# Patient Record
Sex: Male | Born: 1940 | Race: Black or African American | Hispanic: No | Marital: Married | Smoking: Current every day smoker
Health system: Southern US, Community
[De-identification: ages and names within clinical notes are randomized; demographics above are authoritative.]

## PROBLEM LIST (undated history)

## (undated) DIAGNOSIS — Z8719 Personal history of other diseases of the digestive system: Secondary | ICD-10-CM

## (undated) DIAGNOSIS — I499 Cardiac arrhythmia, unspecified: Secondary | ICD-10-CM

## (undated) DIAGNOSIS — Z86010 Personal history of colonic polyps: Secondary | ICD-10-CM

## (undated) DIAGNOSIS — M1990 Unspecified osteoarthritis, unspecified site: Secondary | ICD-10-CM

## (undated) DIAGNOSIS — I071 Rheumatic tricuspid insufficiency: Secondary | ICD-10-CM

## (undated) DIAGNOSIS — H269 Unspecified cataract: Secondary | ICD-10-CM

## (undated) DIAGNOSIS — K219 Gastro-esophageal reflux disease without esophagitis: Secondary | ICD-10-CM

## (undated) DIAGNOSIS — I493 Ventricular premature depolarization: Secondary | ICD-10-CM

## (undated) DIAGNOSIS — I10 Essential (primary) hypertension: Secondary | ICD-10-CM

## (undated) DIAGNOSIS — I209 Angina pectoris, unspecified: Secondary | ICD-10-CM

## (undated) DIAGNOSIS — G4730 Sleep apnea, unspecified: Secondary | ICD-10-CM

## (undated) DIAGNOSIS — C801 Malignant (primary) neoplasm, unspecified: Secondary | ICD-10-CM

## (undated) DIAGNOSIS — E119 Type 2 diabetes mellitus without complications: Secondary | ICD-10-CM

## (undated) DIAGNOSIS — Z860101 Personal history of adenomatous and serrated colon polyps: Secondary | ICD-10-CM

## (undated) HISTORY — PX: COLONOSCOPY: SHX174

## (undated) HISTORY — PX: EYE SURGERY: SHX253

---

## 2005-05-13 ENCOUNTER — Ambulatory Visit: Payer: Self-pay | Admitting: Internal Medicine

## 2005-07-27 ENCOUNTER — Ambulatory Visit: Payer: Self-pay | Admitting: Gastroenterology

## 2005-09-18 ENCOUNTER — Ambulatory Visit: Payer: Self-pay | Admitting: Gastroenterology

## 2006-03-25 ENCOUNTER — Emergency Department: Payer: Self-pay

## 2006-03-30 ENCOUNTER — Emergency Department: Payer: Self-pay | Admitting: Internal Medicine

## 2006-08-30 ENCOUNTER — Ambulatory Visit: Payer: Self-pay | Admitting: Gastroenterology

## 2006-09-20 ENCOUNTER — Ambulatory Visit: Payer: Self-pay | Admitting: Unknown Physician Specialty

## 2006-09-20 ENCOUNTER — Other Ambulatory Visit: Payer: Self-pay

## 2006-09-22 ENCOUNTER — Ambulatory Visit: Payer: Self-pay | Admitting: Unknown Physician Specialty

## 2006-09-26 ENCOUNTER — Ambulatory Visit: Payer: Self-pay | Admitting: Unknown Physician Specialty

## 2006-10-21 ENCOUNTER — Ambulatory Visit: Payer: Self-pay | Admitting: Unknown Physician Specialty

## 2007-07-30 ENCOUNTER — Other Ambulatory Visit: Payer: Self-pay

## 2007-07-30 ENCOUNTER — Emergency Department: Payer: Self-pay | Admitting: Emergency Medicine

## 2009-07-09 ENCOUNTER — Ambulatory Visit: Payer: Self-pay | Admitting: Oncology

## 2009-07-24 ENCOUNTER — Inpatient Hospital Stay: Payer: Self-pay | Admitting: Internal Medicine

## 2009-08-04 ENCOUNTER — Ambulatory Visit: Payer: Self-pay | Admitting: Surgery

## 2009-08-08 ENCOUNTER — Ambulatory Visit: Payer: Self-pay | Admitting: Oncology

## 2009-08-14 ENCOUNTER — Ambulatory Visit: Payer: Self-pay | Admitting: Oncology

## 2009-08-27 ENCOUNTER — Ambulatory Visit: Payer: Self-pay | Admitting: Surgery

## 2009-09-08 ENCOUNTER — Ambulatory Visit: Payer: Self-pay | Admitting: Oncology

## 2010-07-20 ENCOUNTER — Ambulatory Visit: Payer: Self-pay | Admitting: Gastroenterology

## 2010-07-23 LAB — PATHOLOGY REPORT

## 2011-09-19 ENCOUNTER — Emergency Department: Payer: Self-pay | Admitting: Emergency Medicine

## 2013-10-29 ENCOUNTER — Ambulatory Visit: Payer: Self-pay | Admitting: Gastroenterology

## 2013-10-30 LAB — PATHOLOGY REPORT

## 2013-12-27 ENCOUNTER — Ambulatory Visit: Payer: Self-pay | Admitting: Gastroenterology

## 2016-01-12 ENCOUNTER — Emergency Department
Admission: EM | Admit: 2016-01-12 | Discharge: 2016-01-12 | Disposition: A | Discharge status: Home / Self Care | Payer: Medicare Other | Attending: Emergency Medicine | Admitting: Emergency Medicine

## 2016-01-12 ENCOUNTER — Emergency Department: Payer: Medicare Other

## 2016-01-12 ENCOUNTER — Encounter: Payer: Self-pay | Admitting: Emergency Medicine

## 2016-01-12 DIAGNOSIS — M545 Low back pain, unspecified: Secondary | ICD-10-CM

## 2016-01-12 DIAGNOSIS — I10 Essential (primary) hypertension: Secondary | ICD-10-CM | POA: Insufficient documentation

## 2016-01-12 HISTORY — DX: Essential (primary) hypertension: I10

## 2016-01-12 MED ORDER — CYCLOBENZAPRINE HCL 5 MG PO TABS: 5.0000 mg | ORAL_TABLET | Freq: Three times a day (TID) | ORAL | 0 refills | Status: AC | PRN

## 2016-01-12 MED ORDER — NABUMETONE 750 MG PO TABS: 750.0000 mg | ORAL_TABLET | Freq: Two times a day (BID) | ORAL | 0 refills | Status: AC

## 2016-01-12 MED ORDER — KETOROLAC TROMETHAMINE 30 MG/ML IJ SOLN
30.0000 mg | Freq: Once | INTRAMUSCULAR | Status: AC
  Administered 2016-01-12: 30 mg via INTRAMUSCULAR
  Filled 2016-01-12: qty 1

## 2016-01-12 NOTE — ED Triage Notes (Signed)
Reports lower back pain x 2 weeks, radiates down right leg.  Seen at clinic 2 wks ago and given muscle relaxer's with no relief.  Skin w/d, MAE

## 2016-01-12 NOTE — Discharge Instructions (Signed)
Your exam and x-rays are essentially normal today. You may be experiencing back pain due to muscle strain or some mild facet arthritis. Take the prescription meds as directed. If symptoms persist, follow-up with your primary provider or Dr. Mack Guise.

## 2016-01-12 NOTE — ED Provider Notes (Signed)
[facility] Emergency Department Provider Note ____________________________________________  Time seen: 1512  I have reviewed the triage vital signs and the nursing notes.  HISTORY  Chief Complaint  Back Pain  HPI Ralph Williamson. is a 75 y.o. male presents to the ED for evaluation of continued low back pain for the last 2+ weeks. Patient describes onset of pain about 2-1/2 weeks ago. He was evaluated by [facility] at the time, and discharged with a prescription of muscle relaxant and pain medicine. He reports completing both medication courses but describes pain relief is only been limited. He describes the pain radiates from the lower back into both hips. He denies any distal paresthesias, foot drop, or incontinence. He denies any remote history of ongoing or chronic back pain. He also denies any recent injury, accident, trauma, or fall. He reports he has never had x-rays of his lower back in the past. He also gives a remote history of some elevated liver function and has concern that that is causing his symptoms at this time.  Past Medical History:  Diagnosis Date  . Hypertension     There are no active problems to display for this patient.   History reviewed. No pertinent surgical history.  Prior to Admission medications   Medication Sig Start Date End Date Taking? Authorizing Provider  cyclobenzaprine (FLEXERIL) 5 MG tablet Take 1 tablet (5 mg total) by mouth 3 (three) times daily as needed for muscle spasms. 01/12/16   Tranisha Tissue V Bacon Michaeline Eckersley, PA-C  nabumetone (RELAFEN) 750 MG tablet Take 1 tablet (750 mg total) by mouth 2 (two) times daily. 01/12/16   Dannielle Karvonen Simrin Vegh, PA-C    Allergies Patient has no known allergies.  No family history on file.  Social History Social History  Substance Use Topics  . Smoking status: Never Smoker  . Smokeless tobacco: Never Used  . Alcohol use Not on file    Review of Systems  Constitutional: Negative for  fever. Cardiovascular: Negative for chest pain. Respiratory: Negative for shortness of breath. Gastrointestinal: Negative for abdominal pain, vomiting and diarrhea. Genitourinary: Negative for dysuria. Musculoskeletal: Positive for back pain. Skin: Negative for rash. Neurological: Negative for headaches, focal weakness or numbness. ____________________________________________  PHYSICAL EXAM:  VITAL SIGNS: ED Triage Vitals  Enc Vitals Group     BP 01/12/16 1349 138/85     Pulse Rate 01/12/16 1349 70     Resp 01/12/16 1349 20     Temp 01/12/16 1349 98 F (36.7 C)     Temp Source 01/12/16 1349 Oral     SpO2 01/12/16 1349 99 %     Weight 01/12/16 1349 220 lb (99.8 kg)     Height 01/12/16 1349 5\' 11"  (1.803 m)     Head Circumference --      Peak Flow --      Pain Score 01/12/16 1350 10     Pain Loc --      Pain Edu? --      Excl. in [place]? --     Constitutional: Alert and oriented. Well appearing and in no distress. Head: Normocephalic and atraumatic. Cardiovascular: Normal rate, regular rhythm. Normal distal pulses. Respiratory: Normal respiratory effort. No wheezes/rales/rhonchi. Gastrointestinal: Soft and nontender. No distention. Musculoskeletal: Patient transitioned slowly from supine to sit. He seems to grimace with discomfort and notes spasm to the lower back with transition. Negative seated straight leg raise bilaterally. Nontender with normal range of motion in all extremities.  Neurologic: Cranial nerves II  through XII grossly intact. Normal LE DTRs bilaterally. Normal gait without ataxia. Normal speech and language. No gross focal neurologic deficits are appreciated. Skin:  Skin is warm, dry and intact. No rash noted. ____________________________________________   RADIOLOGY Lumbar Spine Complete  IMPRESSION: No acute or focal abnormality.  I, Jeremih Dearmas, Dannielle Karvonen, personally viewed and evaluated these images (plain radiographs) as part of my medical decision  making, as well as reviewing the written report by the radiologist. ____________________________________________  PROCEDURES  Toradol 30 mg IM ____________________________________________  INITIAL IMPRESSION / ASSESSMENT AND PLAN / ED COURSE  Patient with acute, intermittent tent low back pain without any focal abdominal skin x-ray. Patient may have back pain related to muscle spasm versus some mild facet arthropathy. He is discharged with prescriptions for Relafen and Flexeril. He will follow-up with his primary care provider or Dr. Mack Guise for ongoing symptom management.  Clinical Course    ____________________________________________  FINAL CLINICAL IMPRESSION(S) / ED DIAGNOSES  Final diagnoses:  Acute midline low back pain without sciatica      Melvenia Needles, PA-C 01/12/16 2356    Nance Pear, MD 01/13/16 0003

## 2017-02-08 DIAGNOSIS — H269 Unspecified cataract: Secondary | ICD-10-CM

## 2017-02-08 DIAGNOSIS — I499 Cardiac arrhythmia, unspecified: Secondary | ICD-10-CM

## 2017-02-08 HISTORY — DX: Cardiac arrhythmia, unspecified: I49.9

## 2017-02-08 HISTORY — DX: Unspecified cataract: H26.9

## 2017-02-15 ENCOUNTER — Encounter: Payer: Self-pay | Admitting: *Deleted

## 2017-02-15 NOTE — Pre-Procedure Instructions (Signed)
RECENT ANGINA. SEEN AND CLEARED BY DR PARASCHOS 02/14/17

## 2017-02-22 ENCOUNTER — Encounter
Admission: RE | Disposition: A | Discharge status: Home / Self Care | Payer: Self-pay | Source: Ambulatory Visit | Attending: Ophthalmology

## 2017-02-22 ENCOUNTER — Ambulatory Visit
Admission: RE | Admit: 2017-02-22 | Discharge: 2017-02-22 | Disposition: A | Discharge status: Home / Self Care | Payer: Medicare Other | Source: Ambulatory Visit | Attending: Ophthalmology | Admitting: Ophthalmology

## 2017-02-22 ENCOUNTER — Other Ambulatory Visit: Payer: Self-pay

## 2017-02-22 ENCOUNTER — Ambulatory Visit: Payer: Medicare Other | Admitting: Certified Registered Nurse Anesthetist

## 2017-02-22 ENCOUNTER — Encounter: Payer: Self-pay | Admitting: *Deleted

## 2017-02-22 DIAGNOSIS — I10 Essential (primary) hypertension: Secondary | ICD-10-CM | POA: Diagnosis not present

## 2017-02-22 DIAGNOSIS — F17200 Nicotine dependence, unspecified, uncomplicated: Secondary | ICD-10-CM | POA: Insufficient documentation

## 2017-02-22 DIAGNOSIS — H2511 Age-related nuclear cataract, right eye: Secondary | ICD-10-CM | POA: Diagnosis not present

## 2017-02-22 DIAGNOSIS — E119 Type 2 diabetes mellitus without complications: Secondary | ICD-10-CM | POA: Diagnosis not present

## 2017-02-22 DIAGNOSIS — K219 Gastro-esophageal reflux disease without esophagitis: Secondary | ICD-10-CM | POA: Diagnosis not present

## 2017-02-22 DIAGNOSIS — Z79899 Other long term (current) drug therapy: Secondary | ICD-10-CM | POA: Insufficient documentation

## 2017-02-22 DIAGNOSIS — G4730 Sleep apnea, unspecified: Secondary | ICD-10-CM | POA: Insufficient documentation

## 2017-02-22 HISTORY — DX: Angina pectoris, unspecified: I20.9

## 2017-02-22 HISTORY — DX: Sleep apnea, unspecified: G47.30

## 2017-02-22 HISTORY — DX: Cardiac arrhythmia, unspecified: I49.9

## 2017-02-22 HISTORY — PX: CATARACT EXTRACTION W/PHACO: SHX586

## 2017-02-22 HISTORY — DX: Unspecified cataract: H26.9

## 2017-02-22 HISTORY — DX: Gastro-esophageal reflux disease without esophagitis: K21.9

## 2017-02-22 SURGERY — PHACOEMULSIFICATION, CATARACT, WITH IOL INSERTION
Anesthesia: Monitor Anesthesia Care | Site: Eye | Laterality: Right | Wound class: Clean

## 2017-02-22 MED ORDER — SODIUM CHLORIDE 0.9 % IV SOLN
INTRAVENOUS | Status: DC
  Administered 2017-02-22: 50 mL/h via INTRAVENOUS

## 2017-02-22 MED ORDER — MOXIFLOXACIN HCL 0.5 % OP SOLN: 1.0000 [drp] | OPHTHALMIC | Status: DC | PRN

## 2017-02-22 MED ORDER — ARMC OPHTHALMIC DILATING DROPS
1.0000 "application " | OPHTHALMIC | Status: AC
  Administered 2017-02-22 (×3): 1 via OPHTHALMIC

## 2017-02-22 MED ORDER — NA CHONDROIT SULF-NA HYALURON 40-17 MG/ML IO SOLN
INTRAOCULAR | Status: AC
  Filled 2017-02-22: qty 1

## 2017-02-22 MED ORDER — MOXIFLOXACIN HCL 0.5 % OP SOLN
OPHTHALMIC | Status: AC
  Filled 2017-02-22: qty 3

## 2017-02-22 MED ORDER — ONDANSETRON HCL 4 MG/2ML IJ SOLN: 4.0000 mg | Freq: Once | INTRAMUSCULAR | Status: DC | PRN

## 2017-02-22 MED ORDER — BISOPROLOL FUMARATE 5 MG PO TABS
5.0000 mg | ORAL_TABLET | Freq: Once | ORAL | Status: AC
  Administered 2017-02-22: 5 mg via ORAL
  Filled 2017-02-22: qty 1

## 2017-02-22 MED ORDER — EPINEPHRINE PF 1 MG/ML IJ SOLN
INTRAOCULAR | Status: DC | PRN
  Administered 2017-02-22: 1 mL via OPHTHALMIC

## 2017-02-22 MED ORDER — EPINEPHRINE PF 1 MG/ML IJ SOLN
INTRAMUSCULAR | Status: AC
  Filled 2017-02-22: qty 1

## 2017-02-22 MED ORDER — ARMC OPHTHALMIC DILATING DROPS
OPHTHALMIC | Status: AC
  Administered 2017-02-22: 1 via OPHTHALMIC
  Filled 2017-02-22: qty 0.4

## 2017-02-22 MED ORDER — POVIDONE-IODINE 5 % OP SOLN
OPHTHALMIC | Status: DC | PRN
  Administered 2017-02-22: 1 via OPHTHALMIC

## 2017-02-22 MED ORDER — LIDOCAINE HCL (PF) 4 % IJ SOLN
INTRAMUSCULAR | Status: AC
  Filled 2017-02-22: qty 5

## 2017-02-22 MED ORDER — MOXIFLOXACIN HCL 0.5 % OP SOLN
OPHTHALMIC | Status: DC | PRN
  Administered 2017-02-22: .2 mL via OPHTHALMIC

## 2017-02-22 MED ORDER — FENTANYL CITRATE (PF) 100 MCG/2ML IJ SOLN
INTRAMUSCULAR | Status: AC
  Filled 2017-02-22: qty 2

## 2017-02-22 MED ORDER — NA CHONDROIT SULF-NA HYALURON 40-17 MG/ML IO SOLN
INTRAOCULAR | Status: DC | PRN
  Administered 2017-02-22: 1 mL via INTRAOCULAR

## 2017-02-22 MED ORDER — CARBACHOL 0.01 % IO SOLN
INTRAOCULAR | Status: DC | PRN
  Administered 2017-02-22: .5 mL via INTRAOCULAR

## 2017-02-22 MED ORDER — FENTANYL CITRATE (PF) 100 MCG/2ML IJ SOLN
INTRAMUSCULAR | Status: DC | PRN
  Administered 2017-02-22: 50 ug via INTRAVENOUS

## 2017-02-22 MED ORDER — POVIDONE-IODINE 5 % OP SOLN
OPHTHALMIC | Status: AC
  Filled 2017-02-22: qty 30

## 2017-02-22 MED ORDER — LIDOCAINE HCL (PF) 4 % IJ SOLN
INTRAOCULAR | Status: DC | PRN
  Administered 2017-02-22: 2 mL via OPHTHALMIC

## 2017-02-22 MED ORDER — FENTANYL CITRATE (PF) 100 MCG/2ML IJ SOLN
25.0000 ug | INTRAMUSCULAR | Status: DC | PRN
Start: 2017-02-22 — End: 2017-02-22

## 2017-02-22 SURGICAL SUPPLY — 16 items
GLOVE BIO SURGEON STRL SZ8 (GLOVE) ×3 IMPLANT
GLOVE BIOGEL M 6.5 STRL (GLOVE) ×3 IMPLANT
GLOVE SURG LX 8.0 MICRO (GLOVE) ×2
GLOVE SURG LX STRL 8.0 MICRO (GLOVE) ×1 IMPLANT
GOWN STRL REUS W/ TWL LRG LVL3 (GOWN DISPOSABLE) ×2 IMPLANT
GOWN STRL REUS W/TWL LRG LVL3 (GOWN DISPOSABLE) ×6
LABEL CATARACT MEDS ST (LABEL) ×3 IMPLANT
LENS IOL TECNIS ITEC 13.5 (Intraocular Lens) ×2 IMPLANT
PACK CATARACT (MISCELLANEOUS) ×3 IMPLANT
PACK CATARACT BRASINGTON LX (MISCELLANEOUS) ×3 IMPLANT
PACK EYE AFTER SURG (MISCELLANEOUS) ×3 IMPLANT
SOL BSS BAG (MISCELLANEOUS) ×3
SOLUTION BSS BAG (MISCELLANEOUS) ×1 IMPLANT
SYR 5ML LL (SYRINGE) ×3 IMPLANT
WATER STERILE IRR 250ML POUR (IV SOLUTION) ×3 IMPLANT
WIPE NON LINTING 3.25X3.25 (MISCELLANEOUS) ×3 IMPLANT

## 2017-02-22 NOTE — H&P (Signed)
All labs reviewed. Abnormal studies sent to patients PCP when indicated.  Previous H&P reviewed, patient examined, there are NO CHANGES.  Ralph Commons Porfilio1/15/20197:17 AM

## 2017-02-22 NOTE — Discharge Instructions (Signed)
Eye Surgery Discharge Instructions  Expect mild scratchy sensation or mild soreness. DO NOT RUB YOUR EYE!  The day of surgery:  Minimal physical activity, but bed rest is not required  No reading, computer work, or close hand work  No bending, lifting, or straining.  May watch TV  For 24 hours:  No driving, legal decisions, or alcoholic beverages  Safety precautions  Eat anything you prefer: It is better to start with liquids, then soup then solid foods.  _____ Eye patch should be worn until postoperative exam tomorrow.  ____ Solar shield eyeglasses should be worn for comfort in the sunlight/patch while sleeping  Resume all regular medications including aspirin or Coumadin if these were discontinued prior to surgery. You may shower, bathe, shave, or wash your hair. Tylenol may be taken for mild discomfort.  Call your doctor if you experience significant pain, nausea, or vomiting, fever > 101 or other signs of infection. [phone] or [phone] Specific instructions:  Follow-up Information    Birder Robson, MD Follow up on 02/23/2017.   Specialty:  Ophthalmology Why:  9:10 Contact information: [address] [place] [address] [phone]

## 2017-02-22 NOTE — Anesthesia Post-op Follow-up Note (Signed)
Anesthesia QCDR form completed.        

## 2017-02-22 NOTE — Op Note (Signed)
PREOPERATIVE DIAGNOSIS:  Nuclear sclerotic cataract of the right eye.   POSTOPERATIVE DIAGNOSIS:  nuclear sclerotic cataract right eye   OPERATIVE PROCEDURE: Procedure(s): CATARACT EXTRACTION PHACO AND INTRAOCULAR LENS PLACEMENT (IOC)   SURGEON:  Birder Robson, MD.   ANESTHESIA:  Anesthesiologist: Molli Barrows, MD CRNA: Demetrius Charity, CRNA  1.      Managed anesthesia care. 2.      0.72ml of Shugarcaine was instilled in the eye following the paracentesis.   COMPLICATIONS:  None.   TECHNIQUE:   Stop and chop   DESCRIPTION OF PROCEDURE:  The patient was examined and consented in the preoperative holding area where the aforementioned topical anesthesia was applied to the right eye and then brought back to the Operating Room where the right eye was prepped and draped in the usual sterile ophthalmic fashion and a lid speculum was placed. A paracentesis was created with the side port blade and the anterior chamber was filled with viscoelastic. A near clear corneal incision was performed with the steel keratome. A continuous curvilinear capsulorrhexis was performed with a cystotome followed by the capsulorrhexis forceps. Hydrodissection and hydrodelineation were carried out with BSS on a blunt cannula. The lens was removed in a stop and chop  technique and the remaining cortical material was removed with the irrigation-aspiration handpiece. The capsular bag was inflated with viscoelastic and the Technis ZCB00  lens was placed in the capsular bag without complication. The remaining viscoelastic was removed from the eye with the irrigation-aspiration handpiece. The wounds were hydrated. The anterior chamber was flushed with Miostat and the eye was inflated to physiologic pressure. 0.51ml of Vigamox was placed in the anterior chamber. The wounds were found to be water tight. The eye was dressed with Vigamox. The patient was given protective glasses to wear throughout the day and a shield with which  to sleep tonight. The patient was also given drops with which to begin a drop regimen today and will follow-up with me in one day. Implant Name Type Inv. Item Serial No. Manufacturer Lot No. LRB No. Used  LENS IOL DIOP 13.5 - [id number] Intraocular Lens LENS IOL DIOP 13.5 [id number] AMO  Right 1   Procedure(s) with comments: CATARACT EXTRACTION PHACO AND INTRAOCULAR LENS PLACEMENT (IOC) (Right) - [place] 01:12 AP% 18.5 CDE 13.36 Fluid pack lot # [id number]  Electronically signed: Birder Robson 02/22/2017 8:16 AM

## 2017-02-22 NOTE — Anesthesia Procedure Notes (Signed)
Procedure Name: MAC Performed by: Zayaan Kozak, CRNA Pre-anesthesia Checklist: Patient identified, Emergency Drugs available, Suction available, Patient being monitored and Timeout performed Oxygen Delivery Method: Nasal cannula       

## 2017-02-22 NOTE — Transfer of Care (Signed)
Immediate Anesthesia Transfer of Care Note  Patient: Ralph Williamson.  Procedure(s) Performed: CATARACT EXTRACTION PHACO AND INTRAOCULAR LENS PLACEMENT (IOC) (Right Eye)  Patient Location: PACU  Anesthesia Type:MAC  Level of Consciousness: awake, alert  and oriented  Airway & Oxygen Therapy: Patient Spontanous Breathing  Post-op Assessment: Report given to RN and Post -op Vital signs reviewed and stable  Post vital signs: Reviewed and stable  Last Vitals:  Vitals:   02/22/17 0639  BP: 116/72  Pulse: 63  Resp: 18  Temp: (!) 35.3 C  SpO2: 97%    Last Pain:  Vitals:   02/22/17 0639  TempSrc: Tympanic      Patients Stated Pain Goal: 0 (30/85/69 4370)  Complications: No apparent anesthesia complications

## 2017-02-22 NOTE — Anesthesia Postprocedure Evaluation (Signed)
Anesthesia Post Note  Patient: Ralph Williamson.  Procedure(s) Performed: CATARACT EXTRACTION PHACO AND INTRAOCULAR LENS PLACEMENT (IOC) (Right Eye)  Patient location during evaluation: PACU Anesthesia Type: MAC Level of consciousness: awake and alert and oriented Vital Signs Assessment: post-procedure vital signs reviewed and stable Respiratory status: respiratory function stable Cardiovascular status: stable Anesthetic complications: no     Last Vitals:  Vitals:   02/22/17 0639  BP: 116/72  Pulse: 63  Resp: 18  Temp: (!) 35.3 C  SpO2: 97%    Last Pain:  Vitals:   02/22/17 0639  TempSrc: Tympanic                 Blima Singer

## 2017-02-22 NOTE — Anesthesia Preprocedure Evaluation (Signed)
Anesthesia Evaluation  Patient identified by MRN, date of birth, ID band Patient awake    Reviewed: Allergy & Precautions, H&P , NPO status , Patient's Chart, lab work & pertinent test results, reviewed documented beta blocker date and time   Airway Mallampati: II  TM Distance: >3 FB Neck ROM: full    Dental no notable dental hx. (+) Teeth Intact   Pulmonary neg pulmonary ROS, sleep apnea and Continuous Positive Airway Pressure Ventilation , Current Smoker,    Pulmonary exam normal breath sounds clear to auscultation       Cardiovascular Exercise Tolerance: Poor hypertension, + angina with exertion negative cardio ROS  + dysrhythmias  Rhythm:regular Rate:Normal     Neuro/Psych negative neurological ROS  negative psych ROS   GI/Hepatic negative GI ROS, Neg liver ROS, GERD  Medicated,  Endo/Other  negative endocrine ROSdiabetes  Renal/GU      Musculoskeletal   Abdominal   Peds  Hematology negative hematology ROS (+)   Anesthesia Other Findings   Reproductive/Obstetrics negative OB ROS                             Anesthesia Physical Anesthesia Plan  ASA: III  Anesthesia Plan: MAC   Post-op Pain Management:    Induction:   PONV Risk Score and Plan:   Airway Management Planned:   Additional Equipment:   Intra-op Plan:   Post-operative Plan:   Informed Consent: I have reviewed the patients History and Physical, chart, labs and discussed the procedure including the risks, benefits and alternatives for the proposed anesthesia with the patient or authorized representative who has indicated his/her understanding and acceptance.     Plan Discussed with: CRNA  Anesthesia Plan Comments:         Anesthesia Quick Evaluation

## 2017-02-23 ENCOUNTER — Encounter: Payer: Self-pay | Admitting: Ophthalmology

## 2017-03-02 ENCOUNTER — Encounter: Payer: Self-pay | Admitting: *Deleted

## 2017-03-02 NOTE — Pre-Procedure Instructions (Signed)
CLEARED BY DR PARASCHOS 02/14/17 ON CHART

## 2017-03-15 ENCOUNTER — Other Ambulatory Visit: Payer: Self-pay

## 2017-03-15 ENCOUNTER — Encounter: Payer: Self-pay | Admitting: *Deleted

## 2017-03-15 ENCOUNTER — Encounter
Admission: RE | Disposition: A | Discharge status: Home / Self Care | Payer: Self-pay | Source: Ambulatory Visit | Attending: Ophthalmology

## 2017-03-15 ENCOUNTER — Ambulatory Visit: Payer: Medicare Other | Admitting: Anesthesiology

## 2017-03-15 ENCOUNTER — Ambulatory Visit
Admission: RE | Admit: 2017-03-15 | Discharge: 2017-03-15 | Disposition: A | Discharge status: Home / Self Care | Payer: Medicare Other | Source: Ambulatory Visit | Attending: Ophthalmology | Admitting: Ophthalmology

## 2017-03-15 DIAGNOSIS — Z79899 Other long term (current) drug therapy: Secondary | ICD-10-CM | POA: Insufficient documentation

## 2017-03-15 DIAGNOSIS — I10 Essential (primary) hypertension: Secondary | ICD-10-CM | POA: Diagnosis not present

## 2017-03-15 DIAGNOSIS — Z9989 Dependence on other enabling machines and devices: Secondary | ICD-10-CM | POA: Diagnosis not present

## 2017-03-15 DIAGNOSIS — K219 Gastro-esophageal reflux disease without esophagitis: Secondary | ICD-10-CM | POA: Insufficient documentation

## 2017-03-15 DIAGNOSIS — F17200 Nicotine dependence, unspecified, uncomplicated: Secondary | ICD-10-CM | POA: Diagnosis not present

## 2017-03-15 DIAGNOSIS — G4730 Sleep apnea, unspecified: Secondary | ICD-10-CM | POA: Insufficient documentation

## 2017-03-15 DIAGNOSIS — H2512 Age-related nuclear cataract, left eye: Secondary | ICD-10-CM | POA: Insufficient documentation

## 2017-03-15 DIAGNOSIS — E119 Type 2 diabetes mellitus without complications: Secondary | ICD-10-CM | POA: Diagnosis not present

## 2017-03-15 HISTORY — DX: Personal history of other diseases of the digestive system: Z87.19

## 2017-03-15 HISTORY — PX: CATARACT EXTRACTION W/PHACO: SHX586

## 2017-03-15 SURGERY — PHACOEMULSIFICATION, CATARACT, WITH IOL INSERTION
Anesthesia: Monitor Anesthesia Care | Site: Eye | Laterality: Left | Wound class: Clean

## 2017-03-15 MED ORDER — SODIUM CHLORIDE 0.9 % IV SOLN
INTRAVENOUS | Status: DC
  Administered 2017-03-15: 12:00:00 via INTRAVENOUS

## 2017-03-15 MED ORDER — EPINEPHRINE PF 1 MG/ML IJ SOLN
INTRAMUSCULAR | Status: AC
  Filled 2017-03-15: qty 2

## 2017-03-15 MED ORDER — MIDAZOLAM HCL 2 MG/2ML IJ SOLN
INTRAMUSCULAR | Status: DC | PRN
  Administered 2017-03-15: 1 mg via INTRAVENOUS

## 2017-03-15 MED ORDER — CARBACHOL 0.01 % IO SOLN
INTRAOCULAR | Status: DC | PRN
  Administered 2017-03-15: 0.5 mL via INTRAOCULAR

## 2017-03-15 MED ORDER — BSS IO SOLN
INTRAOCULAR | Status: DC | PRN
  Administered 2017-03-15: 15 mL via INTRAOCULAR

## 2017-03-15 MED ORDER — ARMC OPHTHALMIC DILATING DROPS
1.0000 "application " | OPHTHALMIC | Status: AC
  Administered 2017-03-15 (×2): 1 via OPHTHALMIC

## 2017-03-15 MED ORDER — LIDOCAINE HCL (PF) 4 % IJ SOLN
INTRAOCULAR | Status: DC | PRN
  Administered 2017-03-15: 4 mL via OPHTHALMIC

## 2017-03-15 MED ORDER — MIDAZOLAM HCL 2 MG/2ML IJ SOLN
INTRAMUSCULAR | Status: AC
  Filled 2017-03-15: qty 2

## 2017-03-15 MED ORDER — MOXIFLOXACIN HCL 0.5 % OP SOLN
OPHTHALMIC | Status: DC | PRN
Start: 2017-03-15 — End: 2017-03-15
  Administered 2017-03-15: 0.2 mL via OPHTHALMIC

## 2017-03-15 MED ORDER — MOXIFLOXACIN HCL 0.5 % OP SOLN
OPHTHALMIC | Status: AC
  Filled 2017-03-15: qty 3

## 2017-03-15 MED ORDER — NA CHONDROIT SULF-NA HYALURON 40-17 MG/ML IO SOLN
INTRAOCULAR | Status: DC | PRN
  Administered 2017-03-15: 1 mL via INTRAOCULAR

## 2017-03-15 MED ORDER — LIDOCAINE HCL (PF) 4 % IJ SOLN
INTRAMUSCULAR | Status: AC
Start: 2017-03-15 — End: ?
  Filled 2017-03-15: qty 5

## 2017-03-15 MED ORDER — FENTANYL CITRATE (PF) 100 MCG/2ML IJ SOLN
INTRAMUSCULAR | Status: AC
  Filled 2017-03-15: qty 2

## 2017-03-15 MED ORDER — FENTANYL CITRATE (PF) 100 MCG/2ML IJ SOLN
INTRAMUSCULAR | Status: DC | PRN
  Administered 2017-03-15: 50 ug via INTRAVENOUS

## 2017-03-15 MED ORDER — POVIDONE-IODINE 5 % OP SOLN
OPHTHALMIC | Status: DC | PRN
  Administered 2017-03-15: 1 via OPHTHALMIC

## 2017-03-15 MED ORDER — MOXIFLOXACIN HCL 0.5 % OP SOLN: 1.0000 [drp] | OPHTHALMIC | Status: DC | PRN

## 2017-03-15 MED ORDER — NA CHONDROIT SULF-NA HYALURON 40-17 MG/ML IO SOLN
INTRAOCULAR | Status: AC
  Filled 2017-03-15: qty 1

## 2017-03-15 MED ORDER — POVIDONE-IODINE 5 % OP SOLN
OPHTHALMIC | Status: AC
Start: 2017-03-15 — End: ?
  Filled 2017-03-15: qty 30

## 2017-03-15 MED ORDER — ARMC OPHTHALMIC DILATING DROPS
OPHTHALMIC | Status: AC
  Filled 2017-03-15: qty 0.4

## 2017-03-15 MED ORDER — EPINEPHRINE PF 1 MG/ML IJ SOLN
INTRAOCULAR | Status: DC | PRN
  Administered 2017-03-15: 12:00:00 via OPHTHALMIC

## 2017-03-15 SURGICAL SUPPLY — 16 items
GLOVE BIO SURGEON STRL SZ8 (GLOVE) ×3 IMPLANT
GLOVE BIOGEL M 6.5 STRL (GLOVE) ×3 IMPLANT
GLOVE SURG LX 8.0 MICRO (GLOVE) ×2
GLOVE SURG LX STRL 8.0 MICRO (GLOVE) ×1 IMPLANT
GOWN STRL REUS W/ TWL LRG LVL3 (GOWN DISPOSABLE) ×2 IMPLANT
GOWN STRL REUS W/TWL LRG LVL3 (GOWN DISPOSABLE) ×6
LABEL CATARACT MEDS ST (LABEL) ×3 IMPLANT
LENS IOL TECNIS ITEC 15.0 (Intraocular Lens) ×2 IMPLANT
PACK CATARACT (MISCELLANEOUS) ×3 IMPLANT
PACK CATARACT BRASINGTON LX (MISCELLANEOUS) ×3 IMPLANT
PACK EYE AFTER SURG (MISCELLANEOUS) ×3 IMPLANT
SOL BSS BAG (MISCELLANEOUS) ×3
SOLUTION BSS BAG (MISCELLANEOUS) ×1 IMPLANT
SYR 5ML LL (SYRINGE) ×3 IMPLANT
WATER STERILE IRR 250ML POUR (IV SOLUTION) ×3 IMPLANT
WIPE NON LINTING 3.25X3.25 (MISCELLANEOUS) ×3 IMPLANT

## 2017-03-15 NOTE — Anesthesia Post-op Follow-up Note (Signed)
Anesthesia QCDR form completed.        

## 2017-03-15 NOTE — Discharge Instructions (Signed)
Eye Surgery Discharge Instructions  Expect mild scratchy sensation or mild soreness. DO NOT RUB YOUR EYE!  The day of surgery:  Minimal physical activity, but bed rest is not required  No reading, computer work, or close hand work  No bending, lifting, or straining.  May watch TV  For 24 hours:  No driving, legal decisions, or alcoholic beverages  Safety precautions  Eat anything you prefer: It is better to start with liquids, then soup then solid foods.  _____ Eye patch should be worn until postoperative exam tomorrow.  ____ Solar shield eyeglasses should be worn for comfort in the sunlight/patch while sleeping  Resume all regular medications including aspirin or Coumadin if these were discontinued prior to surgery. You may shower, bathe, shave, or wash your hair. Tylenol may be taken for mild discomfort.  Call your doctor if you experience significant pain, nausea, or vomiting, fever > 101 or other signs of infection. [phone] or [phone] Specific instructions:  Follow-up Information    Birder Robson, MD Follow up.   Specialty:  Ophthalmology Why:  03/16/17 at 10:50 Contact information: [address] [place] [address] [phone]          Eye Surgery Discharge Instructions  Expect mild scratchy sensation or mild soreness. DO NOT RUB YOUR EYE!  The day of surgery:  Minimal physical activity, but bed rest is not required  No reading, computer work, or close hand work  No bending, lifting, or straining.  May watch TV  For 24 hours:  No driving, legal decisions, or alcoholic beverages  Safety precautions  Eat anything you prefer: It is better to start with liquids, then soup then solid foods.  _____ Eye patch should be worn until postoperative exam tomorrow.  ____ Solar shield eyeglasses should be worn for comfort in the sunlight/patch while sleeping  Resume all regular medications including aspirin or Coumadin if these were  discontinued prior to surgery. You may shower, bathe, shave, or wash your hair. Tylenol may be taken for mild discomfort.  Call your doctor if you experience significant pain, nausea, or vomiting, fever > 101 or other signs of infection. [phone] or [phone] Specific instructions:  Follow-up Information    Birder Robson, MD Follow up.   Specialty:  Ophthalmology Why:  03/16/17 at 10:50 Contact information: [place]  [id number] [phone]

## 2017-03-15 NOTE — Op Note (Signed)
PREOPERATIVE DIAGNOSIS:  Nuclear sclerotic cataract of the left eye.   POSTOPERATIVE DIAGNOSIS:  Nuclear sclerotic cataract of the left eye.   OPERATIVE PROCEDURE: Procedure(s): CATARACT EXTRACTION PHACO AND INTRAOCULAR LENS PLACEMENT (IOC)   SURGEON:  Birder Robson, MD.   ANESTHESIA:  Anesthesiologist: Alvin Critchley, MD CRNA: Lance Muss, CRNA  1.      Managed anesthesia care. 2.     0.12ml of Shugarcaine was instilled following the paracentesis   COMPLICATIONS:  None.   TECHNIQUE:   Stop and chop   DESCRIPTION OF PROCEDURE:  The patient was examined and consented in the preoperative holding area where the aforementioned topical anesthesia was applied to the left eye and then brought back to the Operating Room where the left eye was prepped and draped in the usual sterile ophthalmic fashion and a lid speculum was placed. A paracentesis was created with the side port blade and the anterior chamber was filled with viscoelastic. A near clear corneal incision was performed with the steel keratome. A continuous curvilinear capsulorrhexis was performed with a cystotome followed by the capsulorrhexis forceps. Hydrodissection and hydrodelineation were carried out with BSS on a blunt cannula. The lens was removed in a stop and chop  technique and the remaining cortical material was removed with the irrigation-aspiration handpiece. The capsular bag was inflated with viscoelastic and the Technis ZCB00 lens was placed in the capsular bag without complication. The remaining viscoelastic was removed from the eye with the irrigation-aspiration handpiece. The wounds were hydrated. The anterior chamber was flushed with Miostat and the eye was inflated to physiologic pressure. 0.80ml Vigamox was placed in the anterior chamber. The wounds were found to be water tight. The eye was dressed with Vigamox. The patient was given protective glasses to wear throughout the day and a shield with which to sleep tonight.  The patient was also given drops with which to begin a drop regimen today and will follow-up with me in one day. Implant Name Type Inv. Item Serial No. Manufacturer Lot No. LRB No. Used  LENS IOL DIOP 15.0 - [id number] Intraocular Lens LENS IOL DIOP 15.0 [id number] AMO  Left 1    Procedure(s) with comments: CATARACT EXTRACTION PHACO AND INTRAOCULAR LENS PLACEMENT (IOC) (Left) - [place] 01:07.5 AP% 19.8 CDE 13.32 Fluid Pack lot # [id number]  Electronically signed: Birder Robson 03/15/2017 12:51 PM

## 2017-03-15 NOTE — Anesthesia Preprocedure Evaluation (Signed)
Anesthesia Evaluation  Patient identified by MRN, date of birth, ID band Patient awake    Reviewed: Allergy & Precautions, H&P , NPO status , Patient's Chart, lab work & pertinent test results, reviewed documented beta blocker date and time   Airway Mallampati: II  TM Distance: >3 FB Neck ROM: full    Dental no notable dental hx. (+) Teeth Intact   Pulmonary neg pulmonary ROS, sleep apnea and Continuous Positive Airway Pressure Ventilation , Current Smoker,    Pulmonary exam normal breath sounds clear to auscultation       Cardiovascular Exercise Tolerance: Poor hypertension, + angina with exertion negative cardio ROS  + dysrhythmias  Rhythm:regular Rate:Normal     Neuro/Psych negative neurological ROS  negative psych ROS   GI/Hepatic negative GI ROS, Neg liver ROS, GERD  Medicated,  Endo/Other  negative endocrine ROSdiabetes  Renal/GU      Musculoskeletal   Abdominal   Peds  Hematology negative hematology ROS (+)   Anesthesia Other Findings   Reproductive/Obstetrics negative OB ROS                             Anesthesia Physical  Anesthesia Plan  ASA: III  Anesthesia Plan: MAC   Post-op Pain Management:    Induction:   PONV Risk Score and Plan:   Airway Management Planned:   Additional Equipment:   Intra-op Plan:   Post-operative Plan:   Informed Consent: I have reviewed the patients History and Physical, chart, labs and discussed the procedure including the risks, benefits and alternatives for the proposed anesthesia with the patient or authorized representative who has indicated his/her understanding and acceptance.     Plan Discussed with: CRNA  Anesthesia Plan Comments:         Anesthesia Quick Evaluation

## 2017-03-15 NOTE — H&P (Signed)
All labs reviewed. Abnormal studies sent to patients PCP when indicated.  Previous H&P reviewed, patient examined, there are NO CHANGES.  Ralph Brancato Porfilio2/5/201912:18 PM

## 2017-03-15 NOTE — Anesthesia Postprocedure Evaluation (Signed)
Anesthesia Post Note  Patient: Ralph Williamson.  Procedure(s) Performed: CATARACT EXTRACTION PHACO AND INTRAOCULAR LENS PLACEMENT (IOC) (Left Eye)  Patient location during evaluation: PACU Anesthesia Type: MAC Level of consciousness: awake, awake and alert and oriented Pain management: pain level controlled Vital Signs Assessment: post-procedure vital signs reviewed and stable Respiratory status: spontaneous breathing, nonlabored ventilation and respiratory function stable Cardiovascular status: stable Anesthetic complications: no     Last Vitals:  Vitals:   03/15/17 1124 03/15/17 1252  BP: (!) 141/76 130/76  Pulse: (!) 59   Resp: 18 12  Temp: (!) 36.3 C   SpO2: 99% 100%    Last Pain:  Vitals:   03/15/17 1124  TempSrc: Temporal  PainSc: 0-No pain                 [employer]

## 2017-03-15 NOTE — Transfer of Care (Signed)
Immediate Anesthesia Transfer of Care Note  Patient: Ralph Williamson.  Procedure(s) Performed: CATARACT EXTRACTION PHACO AND INTRAOCULAR LENS PLACEMENT (IOC) (Left Eye)  Patient Location: PACU  Anesthesia Type:MAC  Level of Consciousness: awake, alert  and oriented  Airway & Oxygen Therapy: Patient Spontanous Breathing  Post-op Assessment: Report given to RN and Post -op Vital signs reviewed and stable  Post vital signs: Reviewed and stable  Last Vitals:  Vitals:   03/15/17 1124 03/15/17 1252  BP: (!) 141/76 130/76  Pulse: (!) 59   Resp: 18 12  Temp: (!) 36.3 C   SpO2: 99% 100%    Last Pain:  Vitals:   03/15/17 1124  TempSrc: Temporal  PainSc: 0-No pain         Complications: No apparent anesthesia complications

## 2019-08-30 ENCOUNTER — Other Ambulatory Visit
Admission: RE | Admit: 2019-08-30 | Discharge: 2019-08-30 | Disposition: A | Discharge status: Home / Self Care | Payer: Medicare Other | Source: Ambulatory Visit | Attending: Internal Medicine | Admitting: Internal Medicine

## 2019-08-30 ENCOUNTER — Other Ambulatory Visit: Payer: Self-pay

## 2019-08-30 DIAGNOSIS — Z20822 Contact with and (suspected) exposure to covid-19: Secondary | ICD-10-CM | POA: Insufficient documentation

## 2019-08-30 DIAGNOSIS — Z01812 Encounter for preprocedural laboratory examination: Secondary | ICD-10-CM | POA: Diagnosis present

## 2019-08-30 LAB — SARS CORONAVIRUS 2 (TAT 6-24 HRS): SARS Coronavirus 2: NEGATIVE

## 2019-08-31 ENCOUNTER — Encounter: Payer: Self-pay | Admitting: Internal Medicine

## 2019-09-03 ENCOUNTER — Other Ambulatory Visit: Payer: Self-pay

## 2019-09-03 ENCOUNTER — Encounter
Admission: RE | Disposition: A | Discharge status: Home / Self Care | Payer: Self-pay | Source: Home / Self Care | Attending: Internal Medicine

## 2019-09-03 ENCOUNTER — Ambulatory Visit: Payer: Medicare Other | Admitting: Anesthesiology

## 2019-09-03 ENCOUNTER — Encounter: Payer: Self-pay | Admitting: Internal Medicine

## 2019-09-03 ENCOUNTER — Ambulatory Visit
Admission: RE | Admit: 2019-09-03 | Discharge: 2019-09-03 | Disposition: A | Discharge status: Home / Self Care | Payer: Medicare Other | Attending: Internal Medicine | Admitting: Internal Medicine

## 2019-09-03 DIAGNOSIS — Z79899 Other long term (current) drug therapy: Secondary | ICD-10-CM | POA: Diagnosis not present

## 2019-09-03 DIAGNOSIS — Z1211 Encounter for screening for malignant neoplasm of colon: Secondary | ICD-10-CM | POA: Diagnosis present

## 2019-09-03 DIAGNOSIS — K219 Gastro-esophageal reflux disease without esophagitis: Secondary | ICD-10-CM | POA: Diagnosis not present

## 2019-09-03 DIAGNOSIS — K5730 Diverticulosis of large intestine without perforation or abscess without bleeding: Secondary | ICD-10-CM | POA: Diagnosis not present

## 2019-09-03 DIAGNOSIS — K640 First degree hemorrhoids: Secondary | ICD-10-CM | POA: Diagnosis not present

## 2019-09-03 DIAGNOSIS — G4733 Obstructive sleep apnea (adult) (pediatric): Secondary | ICD-10-CM | POA: Diagnosis not present

## 2019-09-03 DIAGNOSIS — F17210 Nicotine dependence, cigarettes, uncomplicated: Secondary | ICD-10-CM | POA: Diagnosis not present

## 2019-09-03 DIAGNOSIS — E119 Type 2 diabetes mellitus without complications: Secondary | ICD-10-CM | POA: Insufficient documentation

## 2019-09-03 DIAGNOSIS — Z86010 Personal history of colonic polyps: Secondary | ICD-10-CM | POA: Insufficient documentation

## 2019-09-03 DIAGNOSIS — I10 Essential (primary) hypertension: Secondary | ICD-10-CM | POA: Insufficient documentation

## 2019-09-03 DIAGNOSIS — K635 Polyp of colon: Secondary | ICD-10-CM | POA: Insufficient documentation

## 2019-09-03 HISTORY — DX: Malignant (primary) neoplasm, unspecified: C80.1

## 2019-09-03 HISTORY — PX: COLONOSCOPY WITH PROPOFOL: SHX5780

## 2019-09-03 HISTORY — DX: Type 2 diabetes mellitus without complications: E11.9

## 2019-09-03 LAB — GLUCOSE, CAPILLARY: Glucose-Capillary: 140 mg/dL — ABNORMAL HIGH (ref 70–99)

## 2019-09-03 SURGERY — COLONOSCOPY WITH PROPOFOL
Anesthesia: General

## 2019-09-03 MED ORDER — PROPOFOL 10 MG/ML IV BOLUS
INTRAVENOUS | Status: AC
  Filled 2019-09-03: qty 20

## 2019-09-03 MED ORDER — SODIUM CHLORIDE 0.9 % IV SOLN: INTRAVENOUS | Status: DC

## 2019-09-03 MED ORDER — PROPOFOL 500 MG/50ML IV EMUL
INTRAVENOUS | Status: DC | PRN
  Administered 2019-09-03: 50 mg via INTRAVENOUS
  Administered 2019-09-03: 140 ug/kg/min via INTRAVENOUS

## 2019-09-03 MED ORDER — PROPOFOL 500 MG/50ML IV EMUL
INTRAVENOUS | Status: AC
  Filled 2019-09-03: qty 50

## 2019-09-03 NOTE — Anesthesia Procedure Notes (Signed)
Procedure Name: General with mask airway Date/Time: 09/03/2019 9:00 AM Performed by: Gentry Fitz, CRNA Pre-anesthesia Checklist: Patient identified, Emergency Drugs available, Suction available and Patient being monitored Patient Re-evaluated:Patient Re-evaluated prior to induction Oxygen Delivery Method: Simple face mask Preoxygenation: Pre-oxygenation with 100% oxygen

## 2019-09-03 NOTE — Interval H&P Note (Signed)
History and Physical Interval Note:  09/03/2019 8:49 AM  Marlane Mingle.  has presented today for surgery, with the diagnosis of PH POLYPS.  The various methods of treatment have been discussed with the patient and family. After consideration of risks, benefits and other options for treatment, the patient has consented to  Procedure(s): COLONOSCOPY WITH PROPOFOL (N/A) as a surgical intervention.  The patient's history has been reviewed, patient examined, no change in status, stable for surgery.  I have reviewed the patient's chart and labs.  Questions were answered to the patient's satisfaction.     [place], [place]

## 2019-09-03 NOTE — Transfer of Care (Signed)
Immediate Anesthesia Transfer of Care Note  Patient: Ralph Williamson.  Procedure(s) Performed: COLONOSCOPY WITH PROPOFOL (N/A )  Patient Location: PACU  Anesthesia Type:General  Level of Consciousness: drowsy  Airway & Oxygen Therapy: Patient Spontanous Breathing and Patient connected to face mask oxygen  Post-op Assessment: Report given to RN and Post -op Vital signs reviewed and stable  Post vital signs: Reviewed and stable  Last Vitals:  Vitals Value Taken Time  BP 106/64 09/03/19 0916  Temp 36.1 C 09/03/19 0916  Pulse 66 09/03/19 0916  Resp 17 09/03/19 0916  SpO2 100 % 09/03/19 0916  Vitals shown include unvalidated device data.  Last Pain:  Vitals:   09/03/19 0916  TempSrc: Tympanic  PainSc: Asleep         Complications: No complications documented.

## 2019-09-03 NOTE — Op Note (Signed)
[facility] Gastroenterology Patient Name: Ralph Williamson Procedure Date: 09/03/2019 8:46 AM MRN: [id number] Account #: [id number] Date of Birth: 10/23/1940 Admit Type: Outpatient Age: 79 Room: [facility] ENDO ROOM 3 Gender: Male Note Status: Finalized Procedure:             Colonoscopy Indications:           Surveillance: Personal history of adenomatous polyps                         on last colonoscopy > 5 years ago Providers:             Lorie Apley K. Antanette Richwine MD, MD Medicines:             Propofol per Anesthesia Complications:         No immediate complications. Procedure:             Pre-Anesthesia Assessment:                        - The risks and benefits of the procedure and the                         sedation options and risks were discussed with the                         patient. All questions were answered and informed                         consent was obtained.                        - Patient identification and proposed procedure were                         verified prior to the procedure by the nurse. The                         procedure was verified in the procedure room.                        - ASA Grade Assessment: III - A patient with severe                         systemic disease.                        - After reviewing the risks and benefits, the patient                         was deemed in satisfactory condition to undergo the                         procedure.                        After obtaining informed consent, the colonoscope was                         passed under direct vision. Throughout the procedure,  the patient's blood pressure, pulse, and oxygen                         saturations were monitored continuously. The                         Colonoscope was introduced through the anus and                         advanced to the the cecum, identified by appendiceal                         orifice and ileocecal  valve. The colonoscopy was                         performed without difficulty. The patient tolerated                         the procedure well. The quality of the bowel                         preparation was excellent. The ileocecal valve,                         appendiceal orifice, and rectum were photographed. Findings:      The perianal and digital rectal examinations were normal. Pertinent       negatives include normal sphincter tone and no palpable rectal lesions.      Many small and large-mouthed diverticula were found in the sigmoid colon.      A 4 mm polyp was found in the sigmoid colon. The polyp was sessile. The       polyp was removed with a cold biopsy forceps. Resection and retrieval       were complete.      Non-bleeding internal hemorrhoids were found during retroflexion. The       hemorrhoids were Grade I (internal hemorrhoids that do not prolapse).      The exam was otherwise without abnormality. Impression:            - Diverticulosis in the sigmoid colon.                        - One 4 mm polyp in the sigmoid colon, removed with a                         cold biopsy forceps. Resected and retrieved.                        - Non-bleeding internal hemorrhoids.                        - The examination was otherwise normal. Recommendation:        - Patient has a contact number available for                         emergencies. The signs and symptoms of potential                         delayed complications were discussed with  the patient.                         Return to normal activities tomorrow. Written                         discharge instructions were provided to the patient.                        - Resume previous diet.                        - Continue present medications.                        - If polyps are benign or adenomatous without                         dysplasia, I will advise NO further colonoscopy due to                         advanced age  and/or severe comorbidity.                        - The findings and recommendations were discussed with                         the patient.                        - Return to GI office PRN. Procedure Code(s):     --- Professional ---                        [phone], Colonoscopy, flexible; with biopsy, single or                         multiple Diagnosis Code(s):     --- Professional ---                        K57.30, Diverticulosis of large intestine without                         perforation or abscess without bleeding                        K63.5, Polyp of colon                        K64.0, First degree hemorrhoids                        Z86.010, Personal history of colonic polyps CPT copyright 2019 American Medical Association. All rights reserved. The codes documented in this report are preliminary and upon coder review may  be revised to meet current compliance requirements. Efrain Sella MD, MD 09/03/2019 9:13:39 AM This report has been signed electronically. Number of Addenda: 0 Note Initiated On: 09/03/2019 8:46 AM Scope Withdrawal Time: 0 hours 6 minutes 5 seconds  Total Procedure Duration: 0 hours 8 minutes 25 seconds  Estimated Blood Loss:  Estimated blood loss: none.      [place]  [facility]

## 2019-09-03 NOTE — Anesthesia Preprocedure Evaluation (Signed)
Anesthesia Evaluation  Patient identified by MRN, date of birth, ID band Patient awake    Reviewed: Allergy & Precautions, H&P , NPO status , Patient's Chart, lab work & pertinent test results  Airway Mallampati: IV  TM Distance: >3 FB Neck ROM: full  Mouth opening: Limited Mouth Opening  Dental  (+) Chipped   Pulmonary sleep apnea , Current Smoker,    Pulmonary exam normal        Cardiovascular hypertension, Normal cardiovascular exam+ dysrhythmias (reports heart "occasionally skips a beat")      Neuro/Psych negative neurological ROS  negative psych ROS   GI/Hepatic Neg liver ROS, hiatal hernia, GERD  ,  Endo/Other  diabetes  Renal/GU negative Renal ROS  negative genitourinary   Musculoskeletal   Abdominal   Peds  Hematology negative hematology ROS (+)   Anesthesia Other Findings Past Medical History: No date: Anginal pain (HCC)     Comment:  RECENT. SEEN AND CLEARED BY DR PARASCHOS 02/14/17 No date: Cancer [facility])     Comment:  prostate 2019: Cataract     Comment:  right No date: Diabetes mellitus without complication ([place]) 8527: Dysrhythmia     Comment:  "heart skips a beat" No date: GERD (gastroesophageal reflux disease) No date: History of hiatal hernia No date: Hypertension No date: Sleep apnea     Comment:  NO CPAP  Past Surgical History: 02/22/2017: CATARACT EXTRACTION W/PHACO; Right     Comment:  Procedure: CATARACT EXTRACTION PHACO AND INTRAOCULAR               LENS PLACEMENT ([place]);  Surgeon: Birder Robson, MD;                Location: [facility] ORS;  Service: Ophthalmology;  Laterality:              Right;  [place] 01:12 AP% 18.5 CDE 13.36 Fluid pack lot #               [id number] 03/15/2017: CATARACT EXTRACTION W/PHACO; Left     Comment:  Procedure: CATARACT EXTRACTION PHACO AND INTRAOCULAR               LENS PLACEMENT ([facility]);  Surgeon: Birder Robson, MD;                Location: [facility] ORS;  Service:  Ophthalmology;  Laterality:              Left;  [place] 01:07.5 AP% 19.8 CDE 13.32 Fluid Pack lot #               [id number] No date: COLONOSCOPY  BMI    Body Mass Index: 32.22 kg/m      Reproductive/Obstetrics negative OB ROS                             Anesthesia Physical Anesthesia Plan  ASA: III  Anesthesia Plan: General   Post-op Pain Management:    Induction:   PONV Risk Score and Plan: Propofol infusion and TIVA  Airway Management Planned: Simple Face Mask  Additional Equipment:   Intra-op Plan:   Post-operative Plan:   Informed Consent: I have reviewed the patients History and Physical, chart, labs and discussed the procedure including the risks, benefits and alternatives for the proposed anesthesia with the patient or authorized representative who has indicated his/her understanding and acceptance.     Dental Advisory Given  Plan Discussed with: Anesthesiologist, CRNA and Surgeon  Anesthesia  Plan Comments:         Anesthesia Quick Evaluation

## 2019-09-03 NOTE — H&P (Signed)
Outpatient short stay form Pre-procedure 09/03/2019 8:45 AM Ralph Williamson K. Alice Reichert, M.D.  Primary Physician: Juluis Pitch, M.D.  Reason for visit:  Personal history of adenomatous colon polyps.-2012  History of present illness:                            Patient presents for colonoscopy for a personal hx of colon polyps. The patient denies abdominal pain, abnormal weight loss or rectal bleeding.        Current Facility-Administered Medications:  .  0.9 %  sodium chloride infusion, , Intravenous, Continuous, [place], Benay Pike, MD, Last Rate: 20 mL/hr at 09/03/19 6387, New Bag at 09/03/19 5643  Medications Prior to Admission  Medication Sig Dispense Refill Last Dose  . bisoprolol-hydrochlorothiazide (ZIAC) 5-6.25 MG tablet Take 1 tablet by mouth daily.   Past Week at Unknown time  . omeprazole (PRILOSEC) 20 MG capsule Take 20 mg by mouth daily.   Past Week at Unknown time  . cyclobenzaprine (FLEXERIL) 5 MG tablet Take 1 tablet (5 mg total) by mouth 3 (three) times daily as needed for muscle spasms. (Patient not taking: Reported on 02/15/2017) 15 tablet 0   . nabumetone (RELAFEN) 750 MG tablet Take 1 tablet (750 mg total) by mouth 2 (two) times daily. (Patient not taking: Reported on 02/15/2017) 30 tablet 0      No Known Allergies   Past Medical History:  Diagnosis Date  . Anginal pain (HCC)    RECENT. SEEN AND CLEARED BY DR PARASCHOS 02/14/17  . Cancer [facility])    prostate  . Cataract 2019   right  . Diabetes mellitus without complication ([place])   . Dysrhythmia 2019   "heart skips a beat"  . GERD (gastroesophageal reflux disease)   . History of hiatal hernia   . Hypertension   . Sleep apnea    NO CPAP    Review of systems:  Otherwise negative.    Physical Exam  Gen: Alert, oriented. Appears stated age.  HEENT: [place]/AT. PERRLA. Lungs: CTA, no wheezes. CV: RR nl S1, S2. Abd: soft, benign, no masses. BS+ Ext: No edema. Pulses 2+    Planned procedures: Proceed with  colonoscopy. The patient understands the nature of the planned procedure, indications, risks, alternatives and potential complications including but not limited to bleeding, infection, perforation, damage to internal organs and possible oversedation/side effects from anesthesia. The patient agrees and gives consent to proceed.  Please refer to procedure notes for findings, recommendations and patient disposition/instructions.     Destan Franchini K. Alice Reichert, M.D. Gastroenterology 09/03/2019  8:45 AM

## 2019-09-04 LAB — SURGICAL PATHOLOGY

## 2019-09-04 NOTE — Anesthesia Postprocedure Evaluation (Signed)
Anesthesia Post Note  Patient: Ralph Williamson.  Procedure(s) Performed: COLONOSCOPY WITH PROPOFOL (N/A )  Patient location during evaluation: PACU Anesthesia Type: General Level of consciousness: awake and alert Pain management: pain level controlled Vital Signs Assessment: post-procedure vital signs reviewed and stable Respiratory status: spontaneous breathing, nonlabored ventilation and respiratory function stable Cardiovascular status: blood pressure returned to baseline and stable Postop Assessment: no apparent nausea or vomiting Anesthetic complications: no   No complications documented.   Last Vitals:  Vitals:   09/03/19 0936 09/03/19 0946  BP: (!) 134/77 (!) 132/81  Pulse: 63 63  Resp: 21 22  Temp:    SpO2: 100% 99%    Last Pain:  Vitals:   09/04/19 0753  TempSrc:   PainSc: 0-No pain                 Brett Canales Fedora Knisely

## 2021-06-06 ENCOUNTER — Other Ambulatory Visit: Payer: Self-pay | Admitting: Surgery

## 2021-06-06 DIAGNOSIS — S83231A Complex tear of medial meniscus, current injury, right knee, initial encounter: Secondary | ICD-10-CM

## 2021-06-06 DIAGNOSIS — M1711 Unilateral primary osteoarthritis, right knee: Secondary | ICD-10-CM

## 2021-06-18 ENCOUNTER — Ambulatory Visit
Admission: RE | Admit: 2021-06-18 | Discharge: 2021-06-18 | Disposition: A | Discharge status: Home / Self Care | Payer: Medicare PPO | Source: Ambulatory Visit | Attending: Surgery | Admitting: Surgery

## 2021-06-18 DIAGNOSIS — S83231A Complex tear of medial meniscus, current injury, right knee, initial encounter: Secondary | ICD-10-CM | POA: Diagnosis present

## 2021-06-18 DIAGNOSIS — M1711 Unilateral primary osteoarthritis, right knee: Secondary | ICD-10-CM | POA: Insufficient documentation

## 2022-09-17 ENCOUNTER — Encounter: Payer: Self-pay | Admitting: *Deleted

## 2022-09-20 ENCOUNTER — Encounter: Payer: Self-pay | Admitting: *Deleted

## 2022-09-27 ENCOUNTER — Ambulatory Visit: Payer: Medicare PPO | Admitting: Registered Nurse

## 2022-09-27 ENCOUNTER — Ambulatory Visit
Admission: RE | Admit: 2022-09-27 | Discharge: 2022-09-27 | Disposition: A | Discharge status: Home / Self Care | Payer: Medicare PPO | Attending: Gastroenterology | Admitting: Gastroenterology

## 2022-09-27 ENCOUNTER — Encounter: Payer: Self-pay | Admitting: *Deleted

## 2022-09-27 ENCOUNTER — Other Ambulatory Visit: Payer: Self-pay

## 2022-09-27 ENCOUNTER — Encounter
Admission: RE | Disposition: A | Discharge status: Home / Self Care | Payer: Self-pay | Source: Home / Self Care | Attending: Gastroenterology

## 2022-09-27 DIAGNOSIS — K449 Diaphragmatic hernia without obstruction or gangrene: Secondary | ICD-10-CM | POA: Insufficient documentation

## 2022-09-27 DIAGNOSIS — E119 Type 2 diabetes mellitus without complications: Secondary | ICD-10-CM | POA: Diagnosis not present

## 2022-09-27 DIAGNOSIS — K31A11 Gastric intestinal metaplasia without dysplasia, involving the antrum: Secondary | ICD-10-CM | POA: Diagnosis not present

## 2022-09-27 DIAGNOSIS — I10 Essential (primary) hypertension: Secondary | ICD-10-CM | POA: Insufficient documentation

## 2022-09-27 DIAGNOSIS — G4730 Sleep apnea, unspecified: Secondary | ICD-10-CM | POA: Insufficient documentation

## 2022-09-27 DIAGNOSIS — Z79899 Other long term (current) drug therapy: Secondary | ICD-10-CM | POA: Diagnosis not present

## 2022-09-27 DIAGNOSIS — K2970 Gastritis, unspecified, without bleeding: Secondary | ICD-10-CM | POA: Insufficient documentation

## 2022-09-27 DIAGNOSIS — K219 Gastro-esophageal reflux disease without esophagitis: Secondary | ICD-10-CM | POA: Insufficient documentation

## 2022-09-27 DIAGNOSIS — R195 Other fecal abnormalities: Secondary | ICD-10-CM | POA: Insufficient documentation

## 2022-09-27 DIAGNOSIS — Z7984 Long term (current) use of oral hypoglycemic drugs: Secondary | ICD-10-CM | POA: Diagnosis not present

## 2022-09-27 HISTORY — DX: Unspecified osteoarthritis, unspecified site: M19.90

## 2022-09-27 HISTORY — DX: Ventricular premature depolarization: I49.3

## 2022-09-27 HISTORY — DX: Personal history of adenomatous and serrated colon polyps: Z86.0101

## 2022-09-27 HISTORY — DX: Personal history of colonic polyps: Z86.010

## 2022-09-27 HISTORY — DX: Rheumatic tricuspid insufficiency: I07.1

## 2022-09-27 HISTORY — DX: Personal history of other diseases of the digestive system: Z87.19

## 2022-09-27 HISTORY — PX: BIOPSY: SHX5522

## 2022-09-27 HISTORY — PX: ESOPHAGOGASTRODUODENOSCOPY (EGD) WITH PROPOFOL: SHX5813

## 2022-09-27 LAB — GLUCOSE, CAPILLARY: Glucose-Capillary: 96 mg/dL (ref 70–99)

## 2022-09-27 SURGERY — ESOPHAGOGASTRODUODENOSCOPY (EGD) WITH PROPOFOL
Anesthesia: General

## 2022-09-27 MED ORDER — PROPOFOL 10 MG/ML IV BOLUS
INTRAVENOUS | Status: DC | PRN
  Administered 2022-09-27: 30 mg via INTRAVENOUS
  Administered 2022-09-27: 100 mg via INTRAVENOUS
  Administered 2022-09-27: 40 mg via INTRAVENOUS

## 2022-09-27 MED ORDER — GLYCOPYRROLATE 0.2 MG/ML IJ SOLN
INTRAMUSCULAR | Status: DC | PRN
  Administered 2022-09-27: .2 mg via INTRAVENOUS

## 2022-09-27 MED ORDER — LIDOCAINE HCL (CARDIAC) PF 100 MG/5ML IV SOSY
PREFILLED_SYRINGE | INTRAVENOUS | Status: DC | PRN
  Administered 2022-09-27: 50 mg via INTRAVENOUS

## 2022-09-27 MED ORDER — SODIUM CHLORIDE 0.9 % IV SOLN: INTRAVENOUS | Status: DC

## 2022-09-27 NOTE — Op Note (Signed)
[facility] Gastroenterology Patient Name: Ralph Williamson Procedure Date: 09/27/2022 10:42 AM MRN: [id number] Account #: [id number] Date of Birth: 03/04/1940 Admit Type: Outpatient Age: 82 Room: [facility] ENDO ROOM 3 Gender: Male Note Status: Supervisor Override Instrument Name: Patton Salles Endoscope [id number] Procedure:             Upper GI endoscopy Indications:           Suspected gastro-esophageal reflux disease Providers:             Eather Colas MD, MD Referring MD:          Eather Colas MD, MD (Referring MD), Teena Irani.                         Terance Hart, MD (Referring MD) Medicines:             Monitored Anesthesia Care Complications:         No immediate complications. Estimated blood loss:                         Minimal. Procedure:             Pre-Anesthesia Assessment:                        - Prior to the procedure, a History and Physical was                         performed, and patient medications and allergies were                         reviewed. The patient is competent. The risks and                         benefits of the procedure and the sedation options and                         risks were discussed with the patient. All questions                         were answered and informed consent was obtained.                         Patient identification and proposed procedure were                         verified by the physician, the nurse, the                         anesthesiologist, the anesthetist and the technician                         in the endoscopy suite. Mental Status Examination:                         alert and oriented. Airway Examination: normal                         oropharyngeal airway and neck mobility. Respiratory  Examination: clear to auscultation. CV Examination:                         normal. Prophylactic Antibiotics: The patient does not                         require prophylactic antibiotics.  Prior                         Anticoagulants: The patient has taken no anticoagulant                         or antiplatelet agents. ASA Grade Assessment: III - A                         patient with severe systemic disease. After reviewing                         the risks and benefits, the patient was deemed in                         satisfactory condition to undergo the procedure. The                         anesthesia plan was to use monitored anesthesia care                         (MAC). Immediately prior to administration of                         medications, the patient was re-assessed for adequacy                         to receive sedatives. The heart rate, respiratory                         rate, oxygen saturations, blood pressure, adequacy of                         pulmonary ventilation, and response to care were                         monitored throughout the procedure. The physical                         status of the patient was re-assessed after the                         procedure.                        After obtaining informed consent, the endoscope was                         passed under direct vision. Throughout the procedure,                         the patient's blood pressure, pulse, and oxygen  saturations were monitored continuously. The Endoscope                         was introduced through the mouth, and advanced to the                         second part of duodenum. The upper GI endoscopy was                         accomplished without difficulty. The patient tolerated                         the procedure well. Findings:      A small hiatal hernia was present.      The exam of the esophagus was otherwise normal.      Patchy mild inflammation characterized by erythema was found in the       gastric body and in the gastric antrum. Biopsies were taken with a cold       forceps for histology. Estimated blood loss was minimal.       The examined duodenum was normal. Impression:            - Small hiatal hernia.                        - Gastritis. Biopsied.                        - Normal examined duodenum. Recommendation:        - Discharge patient to home.                        - Resume previous diet.                        - Continue present medications.                        - Await pathology results.                        - Return to referring physician as previously                         scheduled. Procedure Code(s):     --- Professional ---                        [phone], Esophagogastroduodenoscopy, flexible,                         transoral; with biopsy, single or multiple Diagnosis Code(s):     --- Professional ---                        K44.9, Diaphragmatic hernia without obstruction or                         gangrene                        K29.70, Gastritis, unspecified, without bleeding  K92.1, Melena (includes Hematochezia) CPT copyright 2022 American Medical Association. All rights reserved. The codes documented in this report are preliminary and upon coder review may  be revised to meet current compliance requirements. Eather Colas MD, MD 09/27/2022 11:25:39 AM Number of Addenda: 0 Note Initiated On: 09/27/2022 10:42 AM Estimated Blood Loss:  Estimated blood loss was minimal.      [facility]

## 2022-09-27 NOTE — Anesthesia Preprocedure Evaluation (Signed)
Anesthesia Evaluation  Patient identified by MRN, date of birth, ID band Patient awake    Reviewed: Allergy & Precautions, H&P , NPO status , Patient's Chart, lab work & pertinent test results  History of Anesthesia Complications Negative for: history of anesthetic complications  Airway Mallampati: IV  TM Distance: >3 FB Neck ROM: full  Mouth opening: Limited Mouth Opening  Dental  (+) Chipped, Dental Advidsory Given, Caps   Pulmonary neg shortness of breath, sleep apnea , neg COPD, neg recent URI, Current Smoker   Pulmonary exam normal        Cardiovascular hypertension, (-) angina (-) Past MI and (-) Cardiac Stents Normal cardiovascular exam+ dysrhythmias (reports heart "occasionally skips a beat") + Valvular Problems/Murmurs      Neuro/Psych negative neurological ROS  negative psych ROS   GI/Hepatic Neg liver ROS, hiatal hernia,GERD  ,,  Endo/Other  diabetes, Well Controlled, Oral Hypoglycemic Agents    Renal/GU negative Renal ROS  negative genitourinary   Musculoskeletal   Abdominal   Peds  Hematology negative hematology ROS (+)   Anesthesia Other Findings Past Medical History: No date: Anginal pain (HCC)     Comment:  RECENT. SEEN AND CLEARED BY DR PARASCHOS 02/14/17 No date: Cancer [facility])     Comment:  prostate 2019: Cataract     Comment:  right No date: Diabetes mellitus without complication (HCC) 2019: Dysrhythmia     Comment:  "heart skips a beat" No date: GERD (gastroesophageal reflux disease) No date: History of hiatal hernia No date: Hypertension No date: Sleep apnea     Comment:  NO CPAP  Past Surgical History: 02/22/2017: CATARACT EXTRACTION W/PHACO; Right     Comment:  Procedure: CATARACT EXTRACTION PHACO AND INTRAOCULAR               LENS PLACEMENT ([facility]);  Surgeon: Galen Manila, MD;                Location: [facility] ORS;  Service: Ophthalmology;  Laterality:              Right;  [place] 01:12 AP%  18.5 CDE 13.36 Fluid pack lot #               [id number] 03/15/2017: CATARACT EXTRACTION W/PHACO; Left     Comment:  Procedure: CATARACT EXTRACTION PHACO AND INTRAOCULAR               LENS PLACEMENT ([facility]);  Surgeon: Galen Manila, MD;                Location: [facility] ORS;  Service: Ophthalmology;  Laterality:              Left;  [place] 01:07.5 AP% 19.8 CDE 13.32 Fluid Pack lot #               [id number] No date: COLONOSCOPY  BMI    Body Mass Index: 32.22 kg/m      Reproductive/Obstetrics negative OB ROS                             Anesthesia Physical Anesthesia Plan  ASA: 3  Anesthesia Plan: General   Post-op Pain Management:    Induction: Intravenous  PONV Risk Score and Plan: Propofol infusion and TIVA  Airway Management Planned: Natural Airway and Nasal Cannula  Additional Equipment:   Intra-op Plan:   Post-operative Plan:   Informed Consent: I have reviewed the patients History and Physical, chart, labs and  discussed the procedure including the risks, benefits and alternatives for the proposed anesthesia with the patient or authorized representative who has indicated his/her understanding and acceptance.     Dental Advisory Given  Plan Discussed with: Anesthesiologist, CRNA and Surgeon  Anesthesia Plan Comments:         Anesthesia Quick Evaluation

## 2022-09-27 NOTE — Transfer of Care (Signed)
Immediate Anesthesia Transfer of Care Note  Patient: Ralph Williamson.  Procedure(s) Performed: ESOPHAGOGASTRODUODENOSCOPY (EGD) WITH PROPOFOL BIOPSY  Patient Location: PACU  Anesthesia Type:General  Level of Consciousness: drowsy  Airway & Oxygen Therapy: Patient Spontanous Breathing  Post-op Assessment: Report given to RN and Post -op Vital signs reviewed and stable  Post vital signs: Reviewed and stable  Last Vitals:  Vitals Value Taken Time  BP 134/86 09/27/22 1124  Temp 36.3 C 09/27/22 1123  Pulse 75 09/27/22 1124  Resp 25 09/27/22 1124  SpO2 100 % 09/27/22 1124  Vitals shown include unfiled device data.  Last Pain:  Vitals:   09/27/22 1123  TempSrc: Tympanic  PainSc: Asleep         Complications: No notable events documented.

## 2022-09-27 NOTE — Interval H&P Note (Signed)
History and Physical Interval Note:  09/27/2022 10:39 AM  Ralph Williamson.  has presented today for surgery, with the diagnosis of GERD,.  The various methods of treatment have been discussed with the patient and family. After consideration of risks, benefits and other options for treatment, the patient has consented to  Procedure(s): ESOPHAGOGASTRODUODENOSCOPY (EGD) WITH PROPOFOL (N/A) as a surgical intervention.  The patient's history has been reviewed, patient examined, no change in status, stable for surgery.  I have reviewed the patient's chart and labs.  Questions were answered to the patient's satisfaction.     Regis Bill  Ok to proceed with EGD

## 2022-09-27 NOTE — H&P (Signed)
Outpatient short stay form Pre-procedure 09/27/2022  Regis Bill, MD  Primary Physician: Dorothey Baseman, MD  Reason for visit:  Melenic stool  History of present illness:    82 y/o gentleman with history of GERD, hypertension, and DM II here for EGD for report of dark stool. Hemoglobin stable. No blood thinners. No family history of GI malignancies. No abdominal surgeries. GERD improved on PPI.    Current Facility-Administered Medications:    0.9 %  sodium chloride infusion, , Intravenous, Continuous, Brisia Schuermann, Rossie Muskrat, MD, Last Rate: 20 mL/hr at 09/27/22 0945, New Bag at 09/27/22 0945  Medications Prior to Admission  Medication Sig Dispense Refill Last Dose   bisoprolol-hydrochlorothiazide (ZIAC) 5-6.25 MG tablet Take 1 tablet by mouth daily.   09/27/2022 at 0630   ibuprofen (ADVIL) 200 MG tablet Take 200 mg by mouth every 6 (six) hours as needed.   09/26/2022   metFORMIN (GLUCOPHAGE-XR) 750 MG 24 hr tablet Take 750 mg by mouth daily with breakfast.   09/26/2022   multivitamin-lutein (OCUVITE-LUTEIN) CAPS capsule Take 1 capsule by mouth daily.   09/26/2022   omeprazole (PRILOSEC) 20 MG capsule Take 20 mg by mouth daily.   09/26/2022   tamsulosin (FLOMAX) 0.4 MG CAPS capsule Take 0.4 mg by mouth daily after breakfast.   09/26/2022   cyclobenzaprine (FLEXERIL) 5 MG tablet Take 1 tablet (5 mg total) by mouth 3 (three) times daily as needed for muscle spasms. (Patient not taking: Reported on 09/27/2022) 15 tablet 0 Not Taking   nabumetone (RELAFEN) 750 MG tablet Take 1 tablet (750 mg total) by mouth 2 (two) times daily. (Patient not taking: Reported on 02/15/2017) 30 tablet 0      No Known Allergies   Past Medical History:  Diagnosis Date   Anginal pain (HCC)    RECENT. SEEN AND CLEARED BY DR PARASCHOS 02/14/17   Arthritis    Cancer (HCC)    prostate   Cataract 2019   right   Diabetes mellitus without complication (HCC)    Dysrhythmia 2019   "heart skips a beat"   GERD  (gastroesophageal reflux disease)    History of Barrett's esophagus    History of hiatal hernia    Hx of adenomatous colonic polyps    Hypertension    Moderate tricuspid regurgitation    Premature ventricular contractions    Sleep apnea    NO CPAP    Review of systems:  Otherwise negative.    Physical Exam  Gen: Alert, oriented. Appears stated age.  HEENT: PERRLA. Lungs: No respiratory distress CV: RRR Abd: soft, benign, no masses Ext: No edema    Planned procedures: Proceed with EGD. The patient understands the nature of the planned procedure, indications, risks, alternatives and potential complications including but not limited to bleeding, infection, perforation, damage to internal organs and possible oversedation/side effects from anesthesia. The patient agrees and gives consent to proceed.  Please refer to procedure notes for findings, recommendations and patient disposition/instructions.     Regis Bill, MD [facility]

## 2022-09-28 ENCOUNTER — Encounter: Payer: Self-pay | Admitting: Gastroenterology

## 2022-10-06 NOTE — Anesthesia Postprocedure Evaluation (Signed)
Anesthesia Post Note  Patient: Ralph Williamson.  Procedure(s) Performed: ESOPHAGOGASTRODUODENOSCOPY (EGD) WITH PROPOFOL BIOPSY  Patient location during evaluation: Endoscopy Anesthesia Type: General Level of consciousness: awake and alert Pain management: pain level controlled Vital Signs Assessment: post-procedure vital signs reviewed and stable Respiratory status: spontaneous breathing, nonlabored ventilation, respiratory function stable and patient connected to nasal cannula oxygen Cardiovascular status: blood pressure returned to baseline and stable Postop Assessment: no apparent nausea or vomiting Anesthetic complications: no   No notable events documented.   Last Vitals:  Vitals:   09/27/22 1133 09/27/22 1143  BP: 113/71 120/65  Pulse: 68 63  Resp: 16 (!) 21  Temp:    SpO2: 97% 98%    Last Pain:  Vitals:   09/28/22 0745  TempSrc:   PainSc: 0-No pain                 Lenard Simmer

## 2023-05-30 DIAGNOSIS — Z1331 Encounter for screening for depression: Secondary | ICD-10-CM | POA: Diagnosis not present

## 2023-05-30 DIAGNOSIS — I10 Essential (primary) hypertension: Secondary | ICD-10-CM | POA: Diagnosis not present

## 2023-05-30 DIAGNOSIS — G4730 Sleep apnea, unspecified: Secondary | ICD-10-CM | POA: Diagnosis not present

## 2023-05-30 DIAGNOSIS — Z8719 Personal history of other diseases of the digestive system: Secondary | ICD-10-CM | POA: Diagnosis not present

## 2023-05-30 DIAGNOSIS — Z0000 Encounter for general adult medical examination without abnormal findings: Secondary | ICD-10-CM | POA: Diagnosis not present

## 2023-05-30 DIAGNOSIS — E119 Type 2 diabetes mellitus without complications: Secondary | ICD-10-CM | POA: Diagnosis not present

## 2023-05-30 DIAGNOSIS — Z8546 Personal history of malignant neoplasm of prostate: Secondary | ICD-10-CM | POA: Diagnosis not present

## 2023-06-01 DIAGNOSIS — I10 Essential (primary) hypertension: Secondary | ICD-10-CM | POA: Diagnosis not present

## 2023-06-01 DIAGNOSIS — E119 Type 2 diabetes mellitus without complications: Secondary | ICD-10-CM | POA: Diagnosis not present

## 2023-06-01 DIAGNOSIS — Z8546 Personal history of malignant neoplasm of prostate: Secondary | ICD-10-CM | POA: Diagnosis not present

## 2023-06-01 DIAGNOSIS — Z0000 Encounter for general adult medical examination without abnormal findings: Secondary | ICD-10-CM | POA: Diagnosis not present

## 2023-08-22 DIAGNOSIS — I071 Rheumatic tricuspid insufficiency: Secondary | ICD-10-CM | POA: Diagnosis not present

## 2023-08-22 DIAGNOSIS — I10 Essential (primary) hypertension: Secondary | ICD-10-CM | POA: Diagnosis not present

## 2023-08-22 DIAGNOSIS — I493 Ventricular premature depolarization: Secondary | ICD-10-CM | POA: Diagnosis not present

## 2023-08-22 DIAGNOSIS — Z720 Tobacco use: Secondary | ICD-10-CM | POA: Diagnosis not present

## 2023-08-22 DIAGNOSIS — E119 Type 2 diabetes mellitus without complications: Secondary | ICD-10-CM | POA: Diagnosis not present

## 2023-08-22 DIAGNOSIS — R42 Dizziness and giddiness: Secondary | ICD-10-CM | POA: Diagnosis not present

## 2023-08-22 DIAGNOSIS — G4730 Sleep apnea, unspecified: Secondary | ICD-10-CM | POA: Diagnosis not present

## 2023-09-01 DIAGNOSIS — R42 Dizziness and giddiness: Secondary | ICD-10-CM | POA: Diagnosis not present

## 2023-10-25 IMAGING — MR MR KNEE*R* W/O CM
6 series · 40 of 40 positions shown · non-contrast
Comparison: None Available.

CLINICAL DATA: Right medial knee pain radiates up to thigh when
walking. History of right knee surgery 2 months ago.

EXAM:
MRI OF THE RIGHT KNEE WITHOUT CONTRAST
TECHNIQUE: Multiplanar, multisequence MR imaging of the knee was performed. No
intravenous contrast was administered.

[Series 8: T2 fat-sat · axial · right · 4.0mm · 0.50mm/px · z∈[-46,+78]mm · 7 of 26 slices shown (1 of 3)]
[im 1/26]
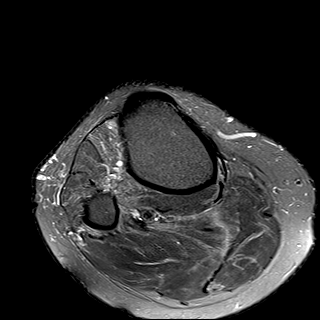
[im 5/26]
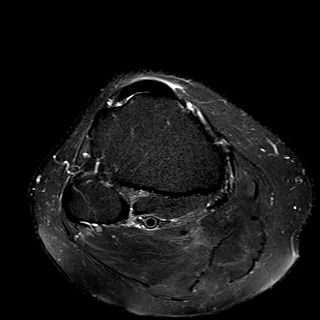
[im 9/26]
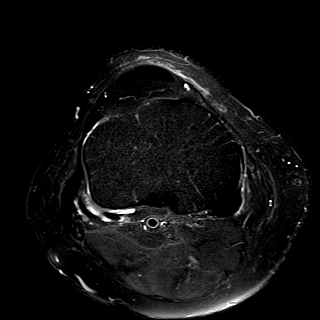
[im 13/26]
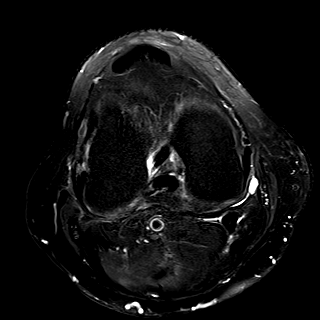
[im 17/26]
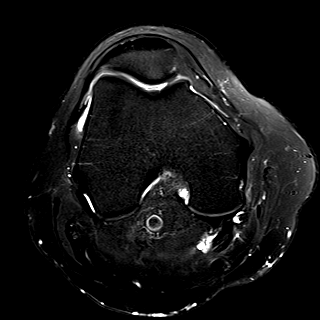
[im 21/26]
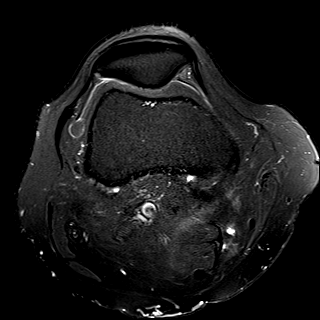
[im 26/26]
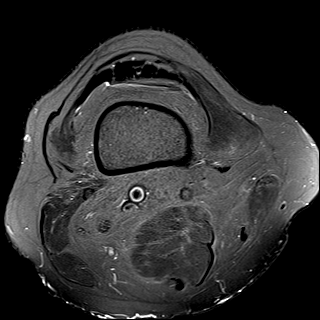

[Series 9: T2 fat-sat · coronal · right · 4.0mm · 0.59mm/px · 6 of 30 slices shown (2 of 3)]
[im 1/30]
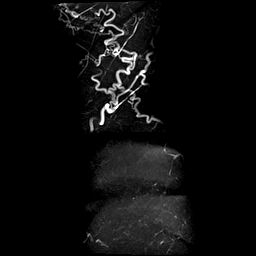
[im 6/30]
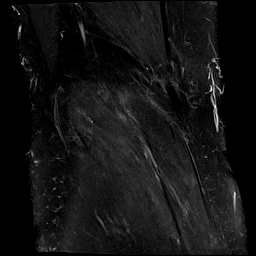
[im 12/30]
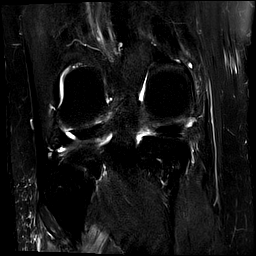
[im 18/30]
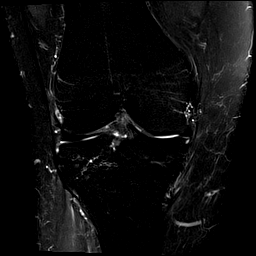
[im 24/30]
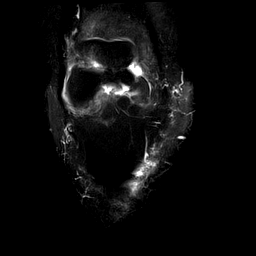
[im 30/30]
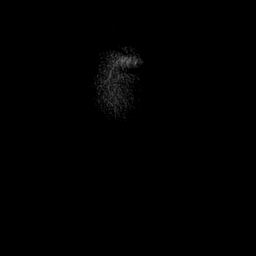

[Series 10: T1 · coronal · right · 4.0mm · 0.59mm/px · 6 of 30 slices shown]
[im 1/30]
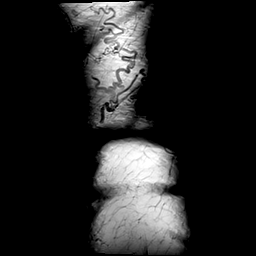
[im 6/30]
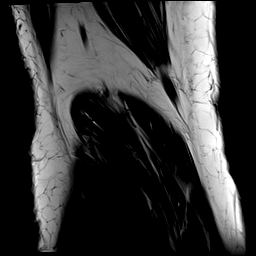
[im 12/30]
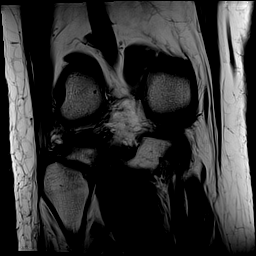
[im 18/30]
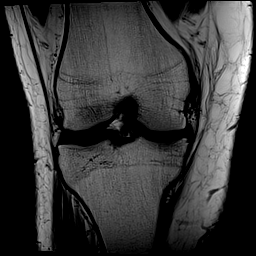
[im 24/30]
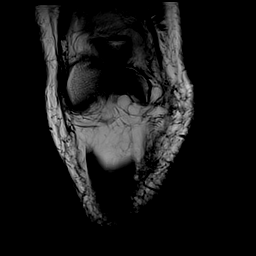
[im 30/30]
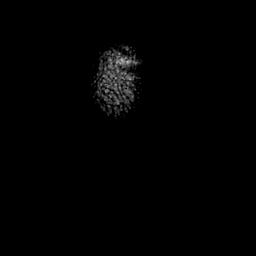

[Series 11: PD fat-sat · coronal · right · 4.0mm · 0.59mm/px · 6 of 30 slices shown (1 of 2)]
[im 1/30]
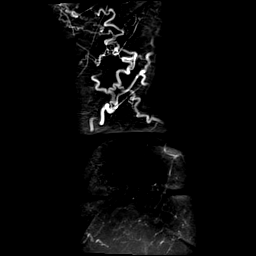
[im 6/30]
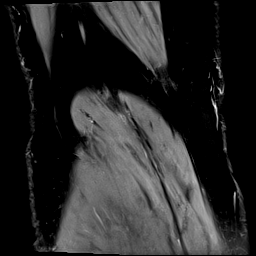
[im 12/30]
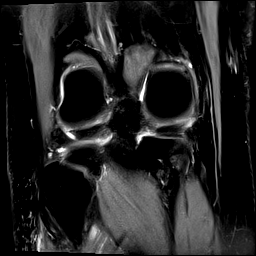
[im 18/30]
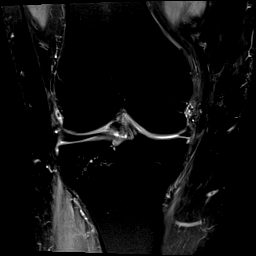
[im 24/30]
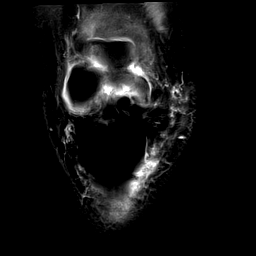
[im 30/30]
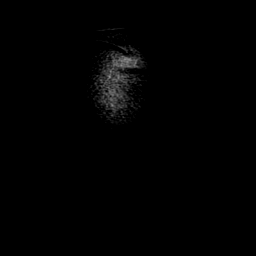

[Series 12: PD fat-sat · sagittal · right · 3.0mm · 0.59mm/px · 7 of 33 slices shown (2 of 2)]
[im 1/33]
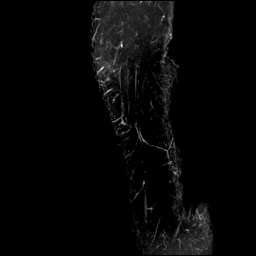
[im 6/33]
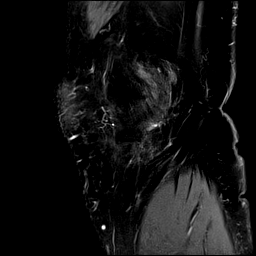
[im 11/33]
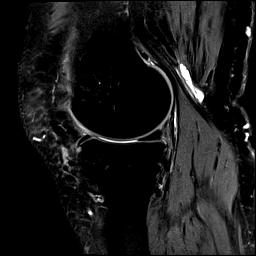
[im 17/33]
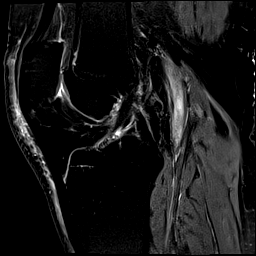
[im 22/33]
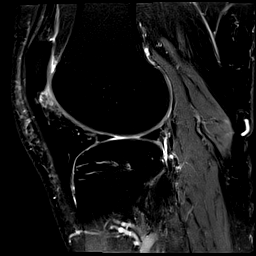
[im 27/33]
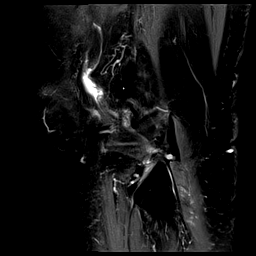
[im 33/33]
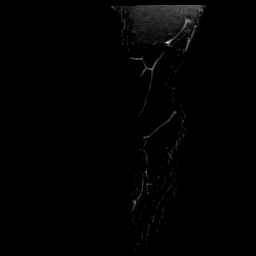

[Series 13: T2 fat-sat · sagittal · right · 3.0mm · 0.59mm/px · 8 of 36 slices shown (3 of 3)]
[im 1/36]
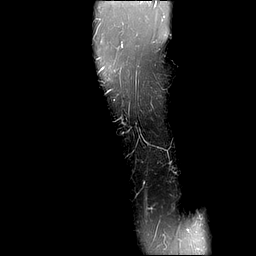
[im 6/36]
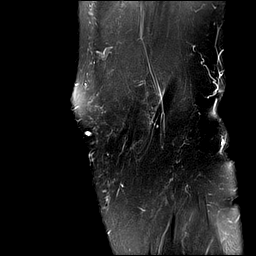
[im 11/36]
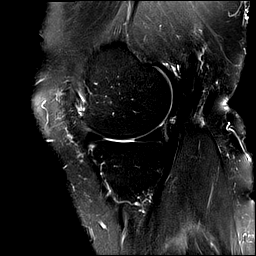
[im 16/36]
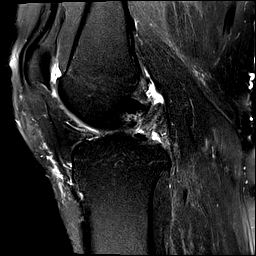
[im 21/36]
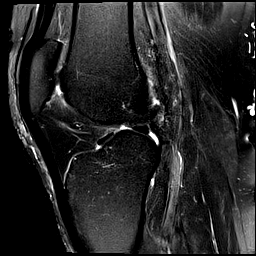
[im 26/36]
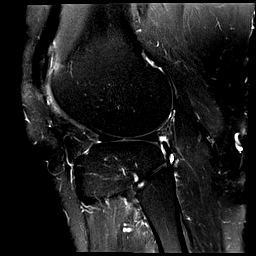
[im 31/36]
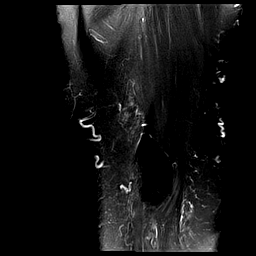
[im 36/36]
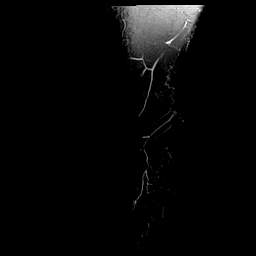

[40 of 40 positions shown; findings below may reference images not displayed]

FINDINGS: MENISCI

Medial: Intrasubstance degeneration in the posterior horn and body.

Lateral: Focal free edge fraying of the posterior horn.

LIGAMENTS

Cruciates: ACL and PCL are intact.

Collaterals: Medial collateral ligament is intact. Lateral
collateral ligament complex is intact.

CARTILAGE

Patellofemoral: Areas of low to intermediate grade cartilage loss of
the patellar facets. Central trochlear chondral fissuring.

Medial: Low grade partial-thickness cartilage loss of the tibial
plateau surface.

Lateral: Intermediate to high grade partial thickness cartilage loss
along the inner lateral tibial plateau and lateral femoral condyle.

JOINT: No significant joint effusion. There is edema within the
superolateral aspect of Hoffa's fat pad.

POPLITEAL FOSSA: Small Baker's cyst with multiple communicating
small cysts along the posteromedial knee.

EXTENSOR MECHANISM: Intact quadriceps tendon. Intact patellar
tendon.

BONES: No acute fracture or dislocation. No aggressive osseous
lesion. Mild tricompartment osteophyte formation.

Other: No fluid collection or hematoma. Muscles are normal.
IMPRESSION: Tricompartment osteoarthritis, worst in the lateral compartment,
cartilaginous abnormalities as described above.

Focal free edge fraying of the posterior horn lateral meniscus.
Intrasubstance degeneration in the posterior horn and body of the
medial meniscus. No definitive meniscus tear.

Mild findings of patellar tendon-lateral femoral condyle impingement
syndrome.

Small Baker's cyst.

## 2023-11-29 DIAGNOSIS — I10 Essential (primary) hypertension: Secondary | ICD-10-CM | POA: Diagnosis not present

## 2023-11-29 DIAGNOSIS — E119 Type 2 diabetes mellitus without complications: Secondary | ICD-10-CM | POA: Diagnosis not present

## 2023-11-29 DIAGNOSIS — Z8546 Personal history of malignant neoplasm of prostate: Secondary | ICD-10-CM | POA: Diagnosis not present

## 2023-11-29 DIAGNOSIS — Z23 Encounter for immunization: Secondary | ICD-10-CM | POA: Diagnosis not present

## 2023-11-29 DIAGNOSIS — J3489 Other specified disorders of nose and nasal sinuses: Secondary | ICD-10-CM | POA: Diagnosis not present
